# Patient Record
Sex: Male | Born: 1980 | Race: White | Hispanic: No | Marital: Single | Smoking: Current every day smoker
Health system: Southern US, Community
[De-identification: ages and names within clinical notes are randomized; demographics above are authoritative.]

## PROBLEM LIST (undated history)

## (undated) HISTORY — PX: HAND SURGERY: SHX662

## (undated) HISTORY — PX: SHOULDER SURGERY: SHX246

---
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
## 2004-08-22 ENCOUNTER — Ambulatory Visit (HOSPITAL_COMMUNITY): Admission: RE | Admit: 2004-08-22 | Discharge: 2004-08-22 | Payer: Self-pay | Admitting: Family Medicine

## 2008-07-29 ENCOUNTER — Ambulatory Visit (HOSPITAL_BASED_OUTPATIENT_CLINIC_OR_DEPARTMENT_OTHER): Admission: RE | Admit: 2008-07-29 | Discharge: 2008-07-29 | Payer: Self-pay | Admitting: Orthopedic Surgery

## 2008-10-21 ENCOUNTER — Ambulatory Visit (HOSPITAL_BASED_OUTPATIENT_CLINIC_OR_DEPARTMENT_OTHER): Admission: RE | Admit: 2008-10-21 | Discharge: 2008-10-21 | Payer: Self-pay | Admitting: Orthopedic Surgery

## 2011-05-08 NOTE — Op Note (Signed)
NAMESHION, BLUESTEIN                  ACCOUNT NO.:  [id number]   MEDICAL RECORD NO.:  [id number]          PATIENT TYPE:  AMB   LOCATION:  DSC                          FACILITY:  [facility]   PHYSICIAN:  Rodney A. Mortenson, M.D.DATE OF BIRTH:  05-Sep-1981   DATE OF PROCEDURE:  10/21/2008  DATE OF DISCHARGE:                               OPERATIVE REPORT   JUSTIFICATION:  A 30 year old male who had a fractured clavicle on the  right fixed with a Rockwood pin.  He is now admitted to have removal of  the pin.  Asymptomatic fracture has healed.  Complications are discussed  preoperatively.  Questions are answered and encouraged.   JUSTIFICATION FOR OUTPATIENT SURGERY:  Minimal morbidity.   PREOPERATIVE DIAGNOSIS:  Healed fracture, right clavicle with a Rockwood  pin.   PREOPERATIVE DIAGNOSIS:  Healed fracture, right clavicle with a Rockwood  pin.   OPERATION:  Removal of Rockwood pin, right clavicle.   SURGEON:  Lenard Galloway. Chaney Malling, MD   ANESTHESIA:  General.   PROCEDURE:  The patient was placed on the operating room table in the  supine position.  After satisfactory anesthesia, he was placed semi-  sitting position.  The right upper extremity was prepped with DuraPrep  and draped out in the usual manner.  An incision was made over the  posterior aspect of the shoulder above the suprascapular spine.  This  area where the pin was located.  An incision was made through the  previous incision site.  Dissection was carried down to the clavicle  itself.  The pin could clearly be seen.  The T-handle remover was  inserted over the large nut and then the Rockwood pin was backed up.  Bleeders were coagulated.  The area was infiltrated with Marcaine and  epinephrine.  The subcutaneous tissue were closed with 2-0 Vicryl and  stainless steel staples were used to close the skin.  Sterile dressing  was applied and the patient returned to the recovery room.  Technically,  the procedure went extremely  well.   DISPOSITION:  1. Norco for pain.  2. Usual postop instructions.  3. To my office on Wednesday of next week.      Rodney A. Chaney Malling, M.D.  Electronically Signed     RAM/MEDQ  D:  10/21/2008  T:  10/21/2008  Job:  [id number]

## 2011-05-08 NOTE — Op Note (Signed)
[name], [name]                  ACCOUNT NO.:  [id number]   MEDICAL RECORD NO.:  [id number]          PATIENT TYPE:  AMB   LOCATION:  DSC                          FACILITY:  [facility]   PHYSICIAN:  Rodney A. Mortenson, M.D.DATE OF BIRTH:  09/05/1981   DATE OF PROCEDURE:  07/29/2008  DATE OF DISCHARGE:                               OPERATIVE REPORT   JUSTIFICATION:  A 30 year old male who sustained a fracture of mid third  of his right clavicle with a small butterfly fragment when he fell.  He  is now admitted for open reduction and internal fixation using a  Rockwood pin.  Complications discussed preoperatively.  Questions  answered and encouraged.   Justification outpatient surgical minimal morbidity.   PREOPERATIVE DIAGNOSIS:  Fracture, mid third right clavicle.   POSTOPERATIVE DIAGNOSIS:  Fracture, mid third right clavicle.   OPERATION:  Open reduction and internal fixation, fracture, right  clavicle using Rockwood pin.   SURGEON:  Lenard Galloway. Chaney Malling, MD   ASSISTANT:  Oris Drone. Petrarca, PA-C   ANESTHESIA:  General.   PROCEDURE:  The patient was placed on the table in supine position.  He  was then after satisfactory anesthesia placed semi-sitting position.  Right upper extremity was prepped with DuraPrep and draped out in the  usual manner.  An incision was made in [employer] over the distal end  of the proximal fragment.  Skin edges were retracted.  Care was taken to  avoid any injury to the superficial nerves.  The platysma was split  along its fibers.  The fracture was clearly seen and easily visible.  There is a large butterfly fragment anteriorly.  The proximal portion  was then elevated into the wound and a drill was passed down the canal  and this was done by hand.  Initially it was a 3.2 drill bit and then a  4.0 drill was then used.  This was all done under radiographic control.  The proximal portion was then tapped using a standard tap.  Attention  was then  turned to the lateral fragment which was brought up into the  wound.  Again, 2 drill bits were used and this was drilled by hand and  this all followed using the C-arm.  The distal part of clavicle was then  tapped in the standard manner and tap was removed.  The final Rockwood  pin was then selected and passed retrograde through the lateral fragment  of posterior lateral aspect of his shoulder.  The fracture was then  reduced and the pin was passed antegrade across the fracture line into  the proximal fragment and excellent stable snug fixation was achieved.  Excellent capture by the large threads were achieved.  The butterfly  fragment was then put in anatomic position and 2-0 Vicryl was passed  around the butterfly fragment and the rest of clavicle with the back  side of the needle and sutured down.  This worked very nicely.  The  platysma was then closed and skin was closed.  An incision made through  the puncture wound posteriorly and the  Rockwood pin was brought out.  The locking nuts had been put in place and the compression across  fracture was achieved before this was accomplished.  Once the screw was  backed out, there was excellent access to trim off the tip of the pin  and then the Rockwood pin was advanced again antegrade and brought down  flexible.  Excellent compression of fracture site was achieved and this  was all visualized with C-arm.  Wounds were closed.  Sterile dressing  was applied, and the patient returned to recovery room in excellent  condition.  Technically, this went extremely well.   DISPOSITION:  1. Sling, right arm.  2. Percocet for pain.  3. Usual postop instructions.  4. Return to my office on Wednesday.      Rodney A. Chaney Malling, M.D.  Electronically Signed     RAM/MEDQ  D:  07/29/2008  T:  07/30/2008  Job:  [id number]

## 2011-09-21 LAB — POCT HEMOGLOBIN-HEMACUE: Hemoglobin: 14.3

## 2011-09-25 LAB — POCT HEMOGLOBIN-HEMACUE: Hemoglobin: 13.6

## 2013-06-22 ENCOUNTER — Other Ambulatory Visit: Payer: Self-pay | Admitting: Family Medicine

## 2013-06-22 DIAGNOSIS — E049 Nontoxic goiter, unspecified: Secondary | ICD-10-CM

## 2013-06-24 ENCOUNTER — Ambulatory Visit
Admission: RE | Admit: 2013-06-24 | Discharge: 2013-06-24 | Disposition: A | Discharge status: Home / Self Care | Payer: BC Managed Care – PPO | Source: Ambulatory Visit | Attending: Family Medicine | Admitting: Family Medicine

## 2013-06-24 DIAGNOSIS — E049 Nontoxic goiter, unspecified: Secondary | ICD-10-CM

## 2014-05-16 ENCOUNTER — Emergency Department (HOSPITAL_COMMUNITY)
Admission: EM | Admit: 2014-05-16 | Discharge: 2014-05-17 | Disposition: A | Discharge status: Home / Self Care | Payer: BC Managed Care – PPO | Attending: Emergency Medicine | Admitting: Emergency Medicine

## 2014-05-16 ENCOUNTER — Encounter (HOSPITAL_COMMUNITY): Payer: Self-pay | Admitting: Emergency Medicine

## 2014-05-16 ENCOUNTER — Emergency Department (HOSPITAL_COMMUNITY): Payer: BC Managed Care – PPO

## 2014-05-16 DIAGNOSIS — S80819A Abrasion, unspecified lower leg, initial encounter: Secondary | ICD-10-CM

## 2014-05-16 DIAGNOSIS — W19XXXA Unspecified fall, initial encounter: Secondary | ICD-10-CM

## 2014-05-16 DIAGNOSIS — S40019A Contusion of unspecified shoulder, initial encounter: Secondary | ICD-10-CM | POA: Insufficient documentation

## 2014-05-16 DIAGNOSIS — S20219A Contusion of unspecified front wall of thorax, initial encounter: Secondary | ICD-10-CM | POA: Insufficient documentation

## 2014-05-16 DIAGNOSIS — F17200 Nicotine dependence, unspecified, uncomplicated: Secondary | ICD-10-CM | POA: Insufficient documentation

## 2014-05-16 DIAGNOSIS — S40012A Contusion of left shoulder, initial encounter: Secondary | ICD-10-CM

## 2014-05-16 DIAGNOSIS — S139XXA Sprain of joints and ligaments of unspecified parts of neck, initial encounter: Secondary | ICD-10-CM | POA: Insufficient documentation

## 2014-05-16 DIAGNOSIS — Y929 Unspecified place or not applicable: Secondary | ICD-10-CM | POA: Insufficient documentation

## 2014-05-16 DIAGNOSIS — Z23 Encounter for immunization: Secondary | ICD-10-CM | POA: Insufficient documentation

## 2014-05-16 DIAGNOSIS — S161XXA Strain of muscle, fascia and tendon at neck level, initial encounter: Secondary | ICD-10-CM

## 2014-05-16 DIAGNOSIS — S0990XA Unspecified injury of head, initial encounter: Secondary | ICD-10-CM | POA: Insufficient documentation

## 2014-05-16 DIAGNOSIS — W138XXA Fall from, out of or through other building or structure, initial encounter: Secondary | ICD-10-CM | POA: Insufficient documentation

## 2014-05-16 DIAGNOSIS — IMO0002 Reserved for concepts with insufficient information to code with codable children: Secondary | ICD-10-CM | POA: Insufficient documentation

## 2014-05-16 DIAGNOSIS — Y9389 Activity, other specified: Secondary | ICD-10-CM | POA: Insufficient documentation

## 2014-05-16 MED ORDER — IBUPROFEN 800 MG PO TABS
800.0000 mg | ORAL_TABLET | Freq: Once | ORAL | Status: AC
  Administered 2014-05-16: 800 mg via ORAL
  Filled 2014-05-16: qty 1

## 2014-05-16 NOTE — ED Notes (Signed)
Pt states he fell approx 10-15 ft from a balcony 20 min ago after the railing broke.  Reports +LOC for "a few seconds".  C/o pain to top of head, posterior neck pain, L shoulder, and upper back pain.

## 2014-05-16 NOTE — ED Provider Notes (Signed)
CSN: [id number]     Arrival date & time 05/16/14  2300 History   First MD Initiated Contact with Patient 05/16/14 2315     Chief Complaint  Patient presents with  . Fall     (Consider location/radiation/quality/duration/timing/severity/associated sxs/prior Treatment) HPI Comments: Patient is a 33 year old male with no significant past medical history. He presents today with complaints of injuries related to a fall. He states that he was leaning against a rail on a second-story deck when the railing gave way and the patient fell approximately 10 feet. States he hit his head and had a brief loss of consciousness. He is complaining of discomfort in his head, neck, left shoulder, and upper back. He denies any numbness or tingling, weakness, or any bowel or bladder complaints. He was brought here by private auto and was placed in a cervical collar in triage.  Patient is a 33 y.o. male presenting with fall. The history is provided by the patient.  Fall This is a new problem. The current episode started less than 1 hour ago. The problem occurs constantly. The problem has not changed since onset.Associated symptoms include headaches. Pertinent negatives include no chest pain, no abdominal pain and no shortness of breath. Nothing aggravates the symptoms. Nothing relieves the symptoms. He has tried nothing for the symptoms. The treatment provided no relief.    History reviewed. No pertinent past medical history. Past Surgical History  Procedure Laterality Date  . Shoulder surgery    . Hand surgery     No family history on file. History  Substance Use Topics  . Smoking status: Current Every Day Smoker  . Smokeless tobacco: Not on file  . Alcohol Use: Yes    Review of Systems  Respiratory: Negative for shortness of breath.   Cardiovascular: Negative for chest pain.  Gastrointestinal: Negative for abdominal pain.  Neurological: Positive for headaches.  All other systems reviewed and are  negative.     Allergies  Review of patient's allergies indicates no known allergies.  Home Medications   Prior to Admission medications   Medication Sig Start Date End Date Taking? Authorizing Provider  ibuprofen (ADVIL,MOTRIN) 200 MG tablet Take 400 mg by mouth every 6 (six) hours as needed for moderate pain.   Yes Historical Provider, MD   BP 132/93  Pulse 68  Temp(Src) 98 F (36.7 C) (Oral)  Resp 18  SpO2 100% Physical Exam  Nursing note and vitals reviewed. Constitutional: He is oriented to person, place, and time. He appears well-developed and well-nourished. No distress.  HENT:  Head: Normocephalic.  Mouth/Throat: Oropharynx is clear and moist.  There is a contusion to the occiput. There is no laceration or bleeding. TMs are clear without hemotympanum.  Eyes: EOM are normal. Pupils are equal, round, and reactive to light.  Neck: Normal range of motion. Neck supple.  There is tenderness to palpation in the soft tissues of the cervical region.  Cardiovascular: Normal rate, regular rhythm and normal heart sounds.   No murmur heard. Pulmonary/Chest: Effort normal and breath sounds normal. No respiratory distress.  Abdominal: Soft. Bowel sounds are normal. He exhibits no distension. There is no tenderness.  Musculoskeletal: Normal range of motion.  Lymphadenopathy:    He has no cervical adenopathy.  Neurological: He is alert and oriented to person, place, and time. No cranial nerve deficit. He exhibits normal muscle tone. Coordination normal.  Skin: Skin is warm and dry. He is not diaphoretic.    ED Course  Procedures (including critical  care time) Labs Review Labs Reviewed - No data to display  Imaging Review No results found.   EKG Interpretation None      MDM   Final diagnoses:  None    Patient is a 33 year old male who presents after a fall from a balcony after the railing broke. He presents with complaints of head and neck injuries and injuries to his  back and shoulder. He appears hemodynamically stable and is in no respiratory distress. Workup reveals negative CT scans of the head and cervical spine. X-ray of the chest reveals no evidence for pneumothorax or widened mediastinum. T-spine films are unremarkable and x-rays of the shoulder and scapula are negative for fracture as well. He has multiple abrasions to his legs which he believes may have come from a rusty nail on the railing. This does not require repair but his tetanus shot will be updated. He will be discharged to home with pain medication, rest, and when necessary followup. I've advised him to use a shoulder sling. He has had shoulder issues in the past and has these at home.    Geoffery Lyonsouglas Dejana Pugsley, MD 05/17/14 [phone]

## 2014-05-17 ENCOUNTER — Emergency Department (HOSPITAL_COMMUNITY): Payer: BC Managed Care – PPO

## 2014-05-17 MED ORDER — HYDROCODONE-ACETAMINOPHEN 5-325 MG PO TABS: 1.0000 | ORAL_TABLET | ORAL | Status: AC | PRN

## 2014-05-17 MED ORDER — OXYCODONE-ACETAMINOPHEN 5-325 MG PO TABS
1.0000 | ORAL_TABLET | Freq: Once | ORAL | Status: AC
  Administered 2014-05-17: 1 via ORAL
  Filled 2014-05-17: qty 1

## 2014-05-17 MED ORDER — TETANUS-DIPHTH-ACELL PERTUSSIS 5-2.5-18.5 LF-MCG/0.5 IM SUSP
0.5000 mL | Freq: Once | INTRAMUSCULAR | Status: AC
  Administered 2014-05-17: 0.5 mL via INTRAMUSCULAR
  Filled 2014-05-17: qty 0.5

## 2014-05-17 NOTE — ED Notes (Signed)
Pt not sure if he had tetanus vaccine.

## 2014-05-17 NOTE — Discharge Instructions (Signed)
Hydrocodone as prescribed as needed for pain.  Rest. Apply ice to affected areas for 20 minutes every 2 hours while awake for the next 2 days. Wear shoulder sling for comfort.  Followup with your primary Dr. if not improving in the next week and return to the emergency department if your symptoms substantially worsen or change.   Blunt Trauma You have been evaluated for injuries. You have been examined and your caregiver has not found injuries serious enough to require hospitalization. It is common to have multiple bruises and sore muscles following an accident. These tend to feel worse for the first 24 hours. You will feel more stiffness and soreness over the next several hours and worse when you wake up the first morning after your accident. After this point, you should begin to improve with each passing day. The amount of improvement depends on the amount of damage done in the accident. Following your accident, if some part of your body does not work as it should, or if the pain in any area continues to increase, you should return to the Emergency Department for re-evaluation.  HOME CARE INSTRUCTIONS  Routine care for sore areas should include:  Ice to sore areas every 2 hours for 20 minutes while awake for the next 2 days.  Drink extra fluids (not alcohol).  Take a hot or warm shower or bath once or twice a day to increase blood flow to sore muscles. This will help you "limber up".  Activity as tolerated. Lifting may aggravate neck or back pain.  Only take over-the-counter or prescription medicines for pain, discomfort, or fever as directed by your caregiver. Do not use aspirin. This may increase bruising or increase bleeding if there are small areas where this is happening. SEEK IMMEDIATE MEDICAL CARE IF:  Numbness, tingling, weakness, or problem with the use of your arms or legs.  A severe headache is not relieved with medications.  There is a change in bowel or bladder  control.  Increasing pain in any areas of the body.  Short of breath or dizzy.  Nauseated, vomiting, or sweating.  Increasing belly (abdominal) discomfort.  Blood in urine, stool, or vomiting blood.  Pain in either shoulder in an area where a shoulder strap would be.  Feelings of lightheadedness or if you have a fainting episode. Sometimes it is not possible to identify all injuries immediately after the trauma. It is important that you continue to monitor your condition after the emergency department visit. If you feel you are not improving, or improving more slowly than should be expected, call your physician. If you feel your symptoms (problems) are worsening, return to the Emergency Department immediately. Document Released: 09/05/2001 Document Revised: 03/03/2012 Document Reviewed: 07/28/2008 [facility] Patient Information 2014 [place], [place].

## 2016-04-28 IMAGING — CR DG SCAPULA*L*
1 series · 1 of 1 positions shown · non-contrast
Comparison: None.

CLINICAL DATA: Fall.

EXAM:
LEFT SCAPULA - 2+ VIEWS

[t scapula ap left]
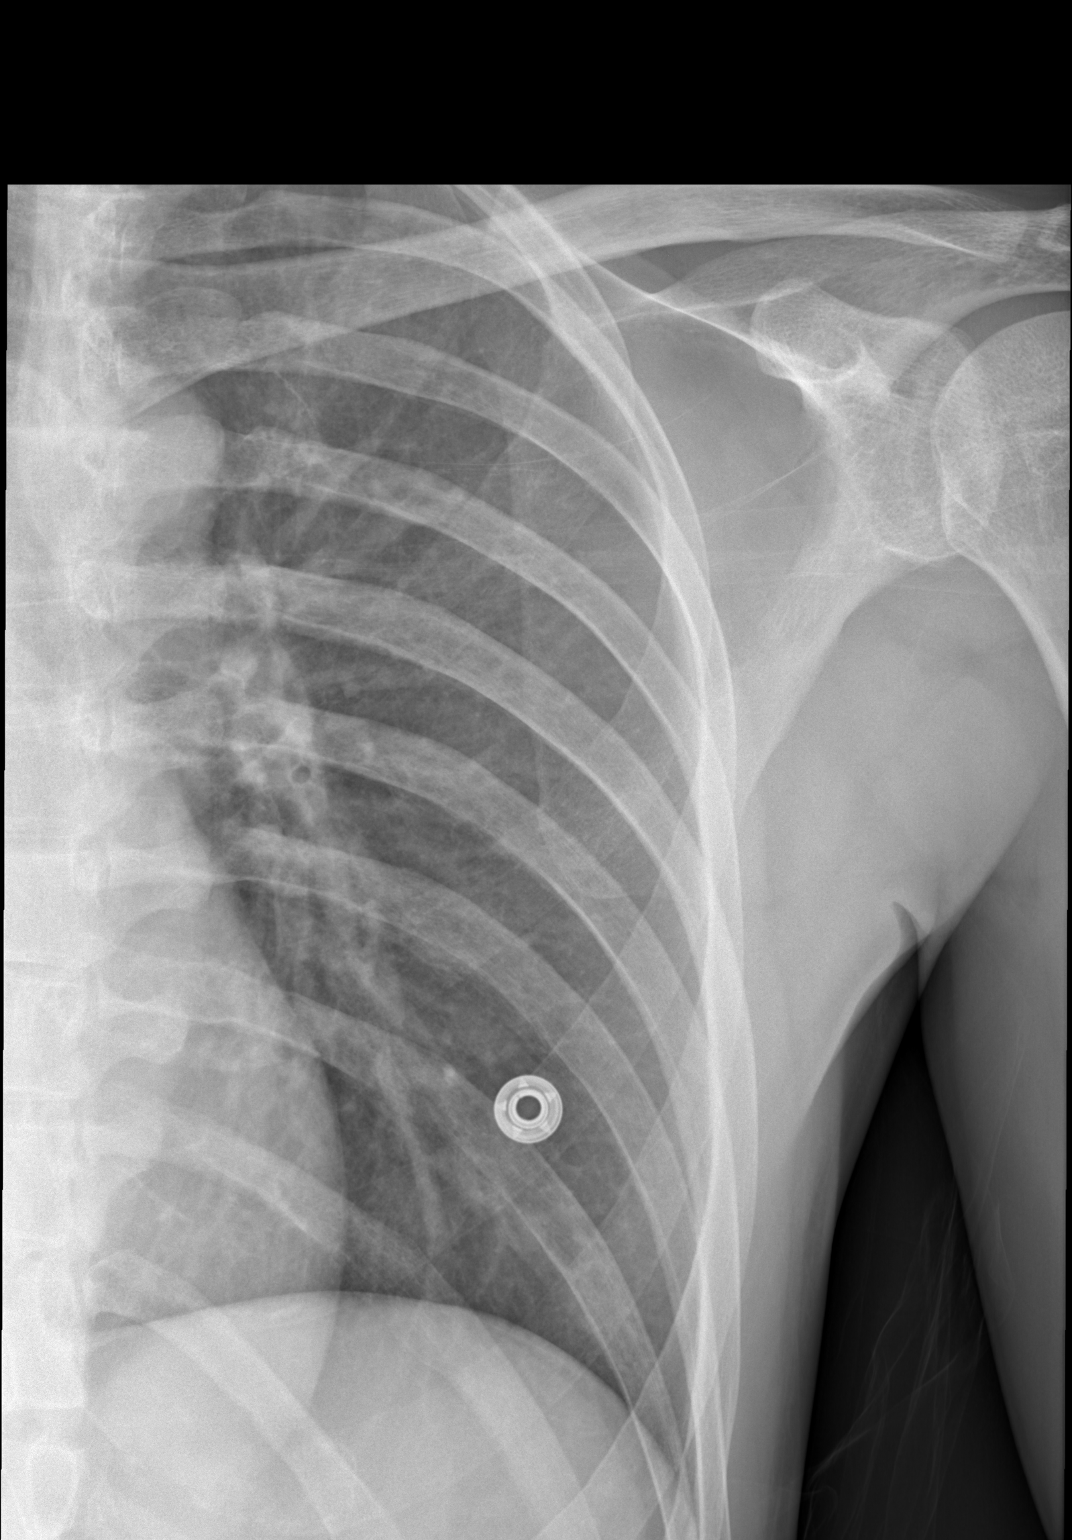

[1 of 1 positions shown; findings below may reference images not displayed]

FINDINGS: Single frontal radiograph of the scapula. No fracture deformity. No
dislocation. No destructive bony lesions. Included view of the left
lung is clear.
IMPRESSION: Negative.

  By: Mehershalal Spaka

## 2016-04-28 IMAGING — CR DG SHOULDER 2+V*L*
2 series · 2 of 2 positions shown · non-contrast
Comparison: MR arthrogram of the left shoulder performed 08/16/2010

CLINICAL DATA: Status post fall; left lateral shoulder pain.

EXAM:
LEFT SHOULDER - 2+ VIEW

[t shoulder external left]
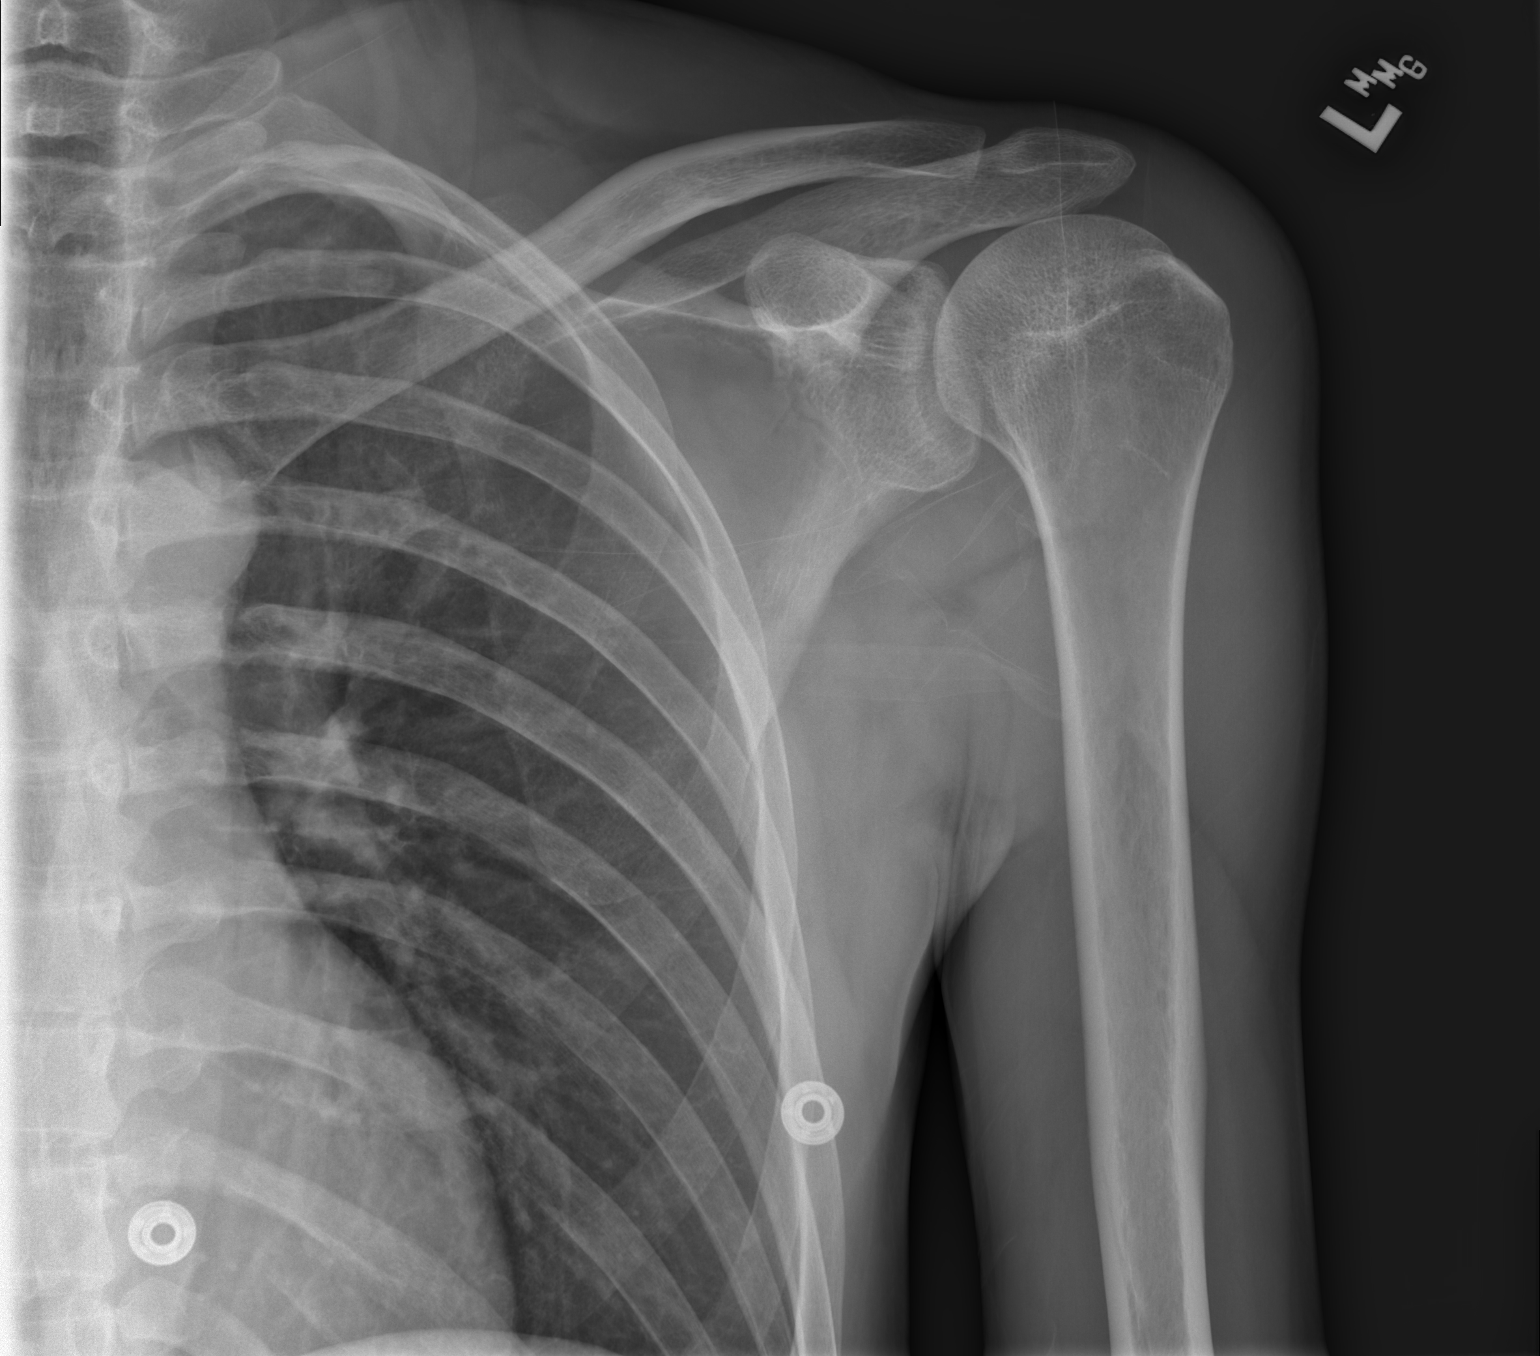

[t shoulder y-view left]
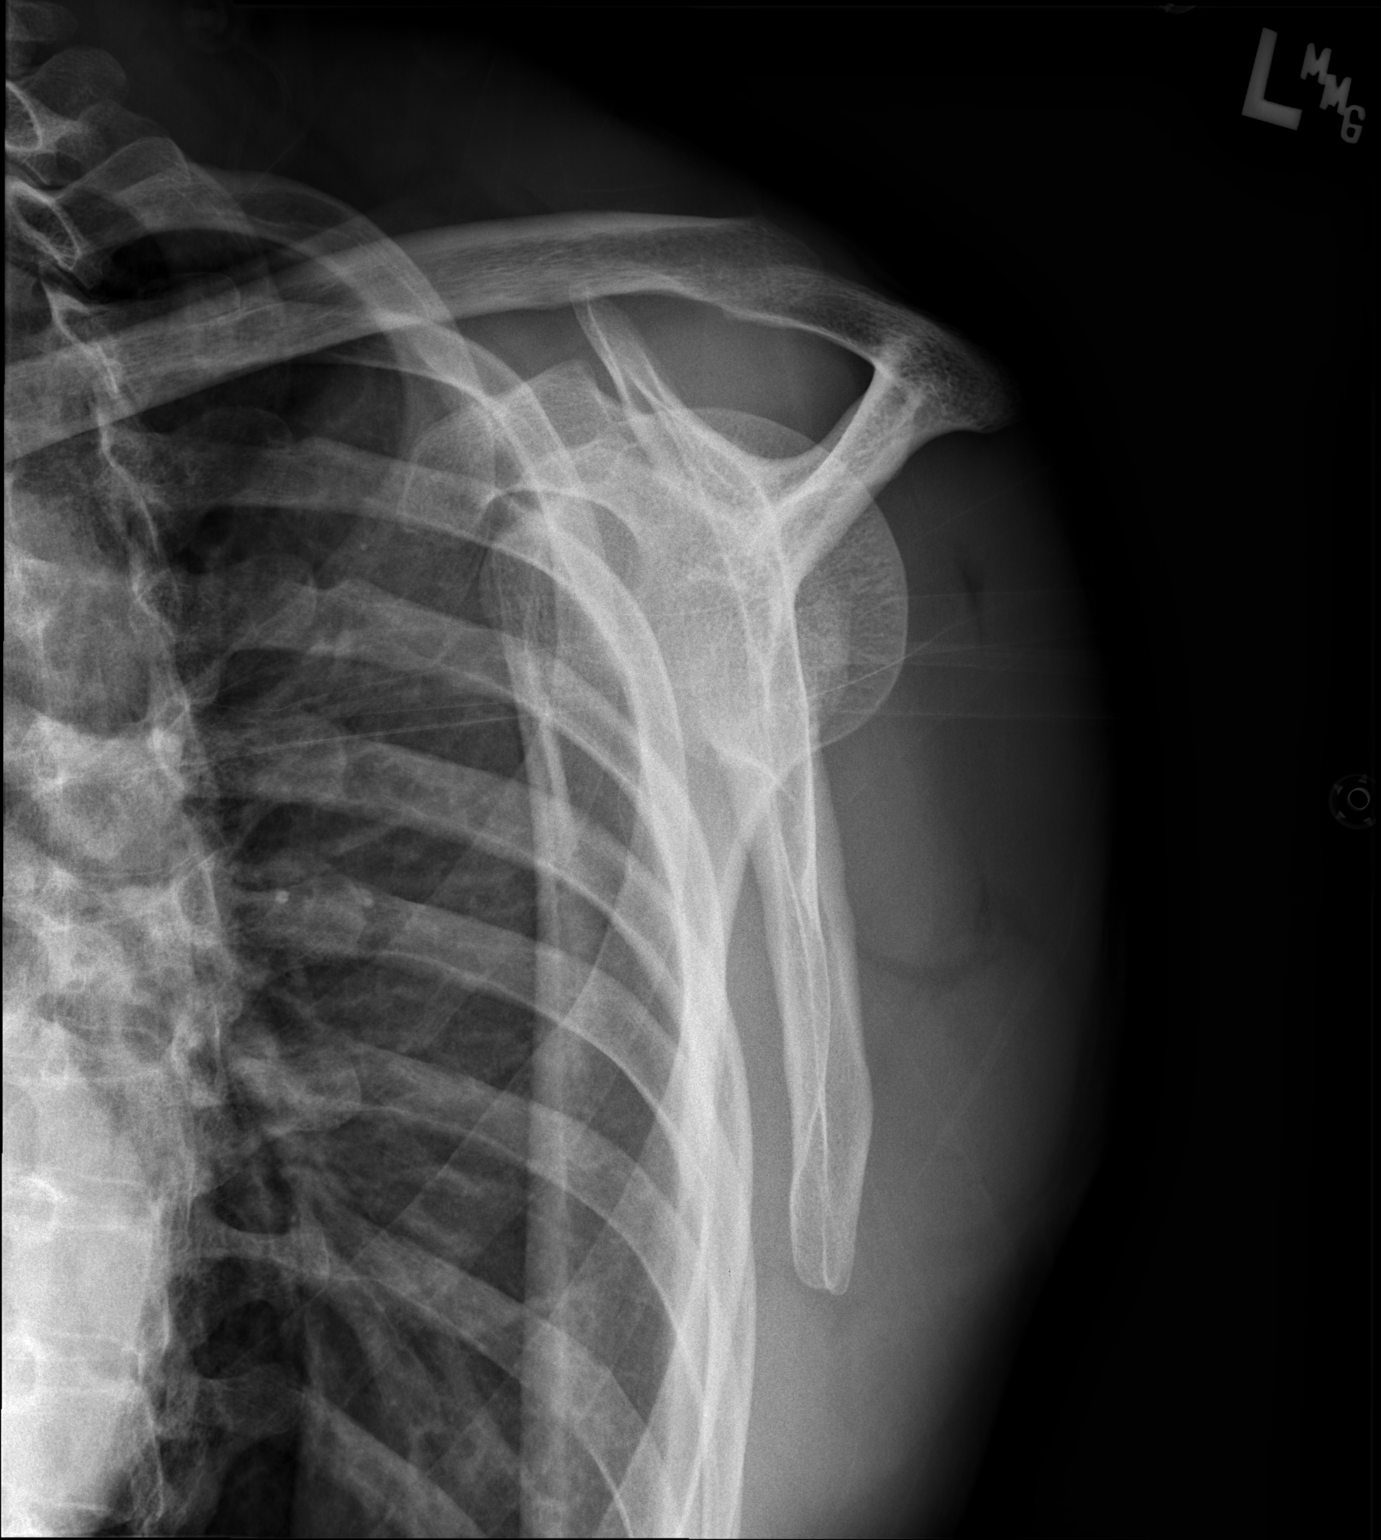

[2 of 2 positions shown; findings below may reference images not displayed]

FINDINGS: There is no evidence of fracture or dislocation. The left humeral
head is seated within the glenoid fossa. The acromioclavicular joint
is unremarkable in appearance. No significant soft tissue
abnormalities are seen. The visualized portions of the left lung are
clear.
IMPRESSION: No evidence of fracture or dislocation.
